# Patient Record
Sex: Male | Born: 2000 | Race: Black or African American | Hispanic: No | Marital: Single | Smoking: Never smoker
Health system: Southern US, Community
[De-identification: ages and names within clinical notes are randomized; demographics above are authoritative.]

## PROBLEM LIST (undated history)

## (undated) DIAGNOSIS — M24411 Recurrent dislocation, right shoulder: Secondary | ICD-10-CM

## (undated) DIAGNOSIS — J302 Other seasonal allergic rhinitis: Secondary | ICD-10-CM

## (undated) DIAGNOSIS — F909 Attention-deficit hyperactivity disorder, unspecified type: Secondary | ICD-10-CM

## (undated) HISTORY — PX: CIRCUMCISION: SUR203

## (undated) HISTORY — PX: HERNIA REPAIR: SHX51

---
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
## 2022-02-07 ENCOUNTER — Other Ambulatory Visit: Payer: Self-pay

## 2022-02-07 ENCOUNTER — Emergency Department (HOSPITAL_BASED_OUTPATIENT_CLINIC_OR_DEPARTMENT_OTHER)
Admission: EM | Admit: 2022-02-07 | Discharge: 2022-02-07 | Disposition: A | Discharge status: Home / Self Care | Payer: Medicaid Other | Attending: Emergency Medicine | Admitting: Emergency Medicine

## 2022-02-07 ENCOUNTER — Encounter (HOSPITAL_BASED_OUTPATIENT_CLINIC_OR_DEPARTMENT_OTHER): Payer: Self-pay | Admitting: Emergency Medicine

## 2022-02-07 DIAGNOSIS — J029 Acute pharyngitis, unspecified: Secondary | ICD-10-CM | POA: Diagnosis present

## 2022-02-07 DIAGNOSIS — J069 Acute upper respiratory infection, unspecified: Secondary | ICD-10-CM | POA: Diagnosis not present

## 2022-02-07 MED ORDER — FLUTICASONE PROPIONATE 50 MCG/ACT NA SUSP: 2.0000 | Freq: Every day | NASAL | 0 refills | Status: AC

## 2022-02-07 MED ORDER — IBUPROFEN 600 MG PO TABS: 600.0000 mg | ORAL_TABLET | Freq: Four times a day (QID) | ORAL | 0 refills | Status: AC | PRN

## 2022-02-07 MED ORDER — IBUPROFEN 400 MG PO TABS
600.0000 mg | ORAL_TABLET | Freq: Once | ORAL | Status: AC
  Administered 2022-02-07: 600 mg via ORAL
  Filled 2022-02-07: qty 1

## 2022-02-07 MED ORDER — DEXAMETHASONE 4 MG PO TABS
10.0000 mg | ORAL_TABLET | Freq: Once | ORAL | Status: AC
  Administered 2022-02-07: 10 mg via ORAL
  Filled 2022-02-07: qty 3

## 2022-02-07 NOTE — ED Triage Notes (Addendum)
Pt c/o left ear pain, left sided sore throat, and left sided jaw pain x 2 days. Pt had negative strep and covid tests at [facility] yesterday.

## 2022-02-07 NOTE — ED Provider Notes (Signed)
[facility] EMERGENCY DEPARTMENT Provider Note  CSN: [id number] Arrival date & time: 02/07/22 4709  Chief Complaint(s) Ear Pain  HPI Shane Mejia is a 21 y.o. male presenting to the emergency department sore throat.  Patient reports sore throat for 2 to 3 days.  He also reports runny nose, no congestion.  He reports that yesterday developed left ear pain.  No fevers or chills.  No nausea or vomiting.  No cough.  No abdominal or chest pain.  Symptoms mild.  Went to UC, told he had allergies, taking allergy medicine without improvement.   Past Medical History Past Medical History:  Diagnosis Date   ADHD (attention deficit hyperactivity disorder)    Seasonal allergies    Shoulder dislocation, recurrent, right    There are no problems to display for this patient.  Home Medication(s) Prior to Admission medications   Medication Sig Start Date End Date Taking? Authorizing Provider  fluticasone (FLONASE) 50 MCG/ACT nasal spray Place 2 sprays into both nostrils daily. 02/07/22 03/09/22 Yes Lonell Grandchild, MD  ibuprofen (ADVIL) 600 MG tablet Take 1 tablet (600 mg total) by mouth every 6 (six) hours as needed. 02/07/22  Yes Lonell Grandchild, MD  cetirizine (ZYRTEC) 10 MG tablet Take by mouth. 09/14/17   [provider]  diazepam (VALIUM) 5 MG tablet Take 5mg  approximately 30 minutes before procedure and repeat if needed 09/11/17   [provider]  diphenhydrAMINE (BENADRYL) 25 MG tablet 1 to 2 tablets every 6 hours as needed for rash and itching. 01/11/20   01/13/20, MD  Lisdexamfetamine Dimesylate (VYVANSE PO) Take by mouth.    [provider]  predniSONE (DELTASONE) 20 MG tablet 2 tabs po daily x 3 days 01/11/20   01/13/20, MD                                                                                                                                    Past Surgical History Past Surgical History:  Procedure Laterality Date    CIRCUMCISION     HERNIA REPAIR     Family History History reviewed. No pertinent family history.  Social History Social History   Tobacco Use   Smoking status: Never   Smokeless tobacco: Never  Substance Use Topics   Alcohol use: No    Comment: child   Allergies Gabapentin  Review of Systems Review of Systems  All other systems reviewed and are negative.   Physical Exam Vital Signs  I have reviewed the triage vital signs BP 139/81   Pulse 67   Temp 98.5 F (36.9 C)   Resp 18   Ht 6' (1.829 m)   Wt 65.8 kg   SpO2 100%   BMI 19.67 kg/m  Physical Exam Vitals and nursing note reviewed.  Constitutional:      General: He is not in acute distress.    Appearance: Normal appearance.  HENT:  Right Ear: Tympanic membrane normal.     Left Ear: Tympanic membrane normal.     Mouth/Throat:     Mouth: Mucous membranes are moist.     Comments: Posterior pharynx erythematous, no tonsillar enlargement or tonsillar exudate.  No facial tenderness Eyes:     Conjunctiva/sclera: Conjunctivae normal.  Cardiovascular:     Rate and Rhythm: Normal rate and regular rhythm.  Pulmonary:     Effort: Pulmonary effort is normal. No respiratory distress.     Breath sounds: Normal breath sounds.  Abdominal:     General: Abdomen is flat.     Palpations: Abdomen is soft.     Tenderness: There is no abdominal tenderness.  Musculoskeletal:     Right lower leg: No edema.     Left lower leg: No edema.  Skin:    General: Skin is warm and dry.     Capillary Refill: Capillary refill takes less than 2 seconds.  Neurological:     Mental Status: He is alert and oriented to person, place, and time. Mental status is at baseline.  Psychiatric:        Mood and Affect: Mood normal.        Behavior: Behavior normal.     ED Results and Treatments Labs (all labs ordered are listed, but only abnormal results are displayed) Labs Reviewed - No data to display                                                                                                                         Radiology No results found.  Pertinent labs & imaging results that were available during my care of the patient were reviewed by me and considered in my medical decision making (see MDM for details).  Medications Ordered in ED Medications  dexamethasone (DECADRON) tablet 10 mg (has no administration in time range)  ibuprofen (ADVIL) tablet 600 mg (has no administration in time range)                                                                                                                                     Procedures Procedures  (including critical care time)  Medical Decision Making / ED Course   MDM:  21 year old male presenting to the emergency department with sore throat and ear pain.  Suspect viral URI.  TM exam normal bilaterally without evidence of otitis media.  No  sign of otitis externa.  No mastoid tenderness to suggest mastoiditis.  He was tested for strep apparently yesterday which was negative, low concern for strep infection currently without tonsillar enlargement or exudates.  No signs of peritonsillar abscess, Ludwig's angina or serious deep space neck infection. Suspect some component of postnasal drip given runny nose, will give Flonase.  Advised taking Tylenol Motrin for his pain. Will discharge patient to home. All questions answered. Patient comfortable with plan of discharge. Return precautions discussed with patient and specified on the after visit summary.       Medicines ordered and prescription drug management: Meds ordered this encounter  Medications   dexamethasone (DECADRON) tablet 10 mg   ibuprofen (ADVIL) tablet 600 mg   fluticasone (FLONASE) 50 MCG/ACT nasal spray    Sig: Place 2 sprays into both nostrils daily.    Dispense:  11.1 mL    Refill:  0   ibuprofen (ADVIL) 600 MG tablet    Sig: Take 1 tablet (600 mg total) by mouth every 6 (six) hours as needed.     Dispense:  30 tablet    Refill:  0    -I have reviewed the patients home medicines and have made adjustments as needed   Co morbidities that complicate the patient evaluation  Past Medical History:  Diagnosis Date   ADHD (attention deficit hyperactivity disorder)    Seasonal allergies    Shoulder dislocation, recurrent, right       Dispostion: Disposition decision including need for hospitalization was considered, and patient discharged from emergency department.    Final Clinical Impression(s) / ED Diagnoses Final diagnoses:  Viral upper respiratory tract infection     This chart was dictated using voice recognition software.  Despite best efforts to proofread,  errors can occur which can change the documentation meaning.    Cristie Hem, MD 02/07/22 [phone]

## 2022-08-24 ENCOUNTER — Other Ambulatory Visit: Payer: Self-pay

## 2022-08-24 ENCOUNTER — Emergency Department (HOSPITAL_BASED_OUTPATIENT_CLINIC_OR_DEPARTMENT_OTHER)
Admission: EM | Admit: 2022-08-24 | Discharge: 2022-08-24 | Disposition: A | Discharge status: Home / Self Care | Payer: Medicaid Other | Attending: Emergency Medicine | Admitting: Emergency Medicine

## 2022-08-24 ENCOUNTER — Encounter (HOSPITAL_BASED_OUTPATIENT_CLINIC_OR_DEPARTMENT_OTHER): Payer: Self-pay | Admitting: Emergency Medicine

## 2022-08-24 ENCOUNTER — Emergency Department (HOSPITAL_BASED_OUTPATIENT_CLINIC_OR_DEPARTMENT_OTHER): Payer: Medicaid Other

## 2022-08-24 DIAGNOSIS — S4991XA Unspecified injury of right shoulder and upper arm, initial encounter: Secondary | ICD-10-CM | POA: Diagnosis present

## 2022-08-24 DIAGNOSIS — Y9339 Activity, other involving climbing, rappelling and jumping off: Secondary | ICD-10-CM | POA: Diagnosis not present

## 2022-08-24 DIAGNOSIS — X509XXA Other and unspecified overexertion or strenuous movements or postures, initial encounter: Secondary | ICD-10-CM | POA: Diagnosis not present

## 2022-08-24 DIAGNOSIS — S43004A Unspecified dislocation of right shoulder joint, initial encounter: Secondary | ICD-10-CM | POA: Insufficient documentation

## 2022-08-24 MED ORDER — PROPOFOL 10 MG/ML IV BOLUS
1.0000 mg/kg | Freq: Once | INTRAVENOUS | Status: AC
  Administered 2022-08-24: 72.6 mg via INTRAVENOUS
  Filled 2022-08-24: qty 20

## 2022-08-24 MED ORDER — PROPOFOL 10 MG/ML IV BOLUS
INTRAVENOUS | Status: AC | PRN
  Administered 2022-08-24: 40 mg via INTRAVENOUS
  Administered 2022-08-24: 72.6 mg via INTRAVENOUS

## 2022-08-24 MED ORDER — FENTANYL CITRATE PF 50 MCG/ML IJ SOSY
25.0000 ug | PREFILLED_SYRINGE | Freq: Once | INTRAMUSCULAR | Status: AC
  Administered 2022-08-24: 25 ug via INTRAVENOUS
  Filled 2022-08-24: qty 1

## 2022-08-24 NOTE — ED Notes (Signed)
Pt able to ambulate and drink water  

## 2022-08-24 NOTE — ED Triage Notes (Signed)
Pt reports he was running track and was trying to jump a fence, feel and hit his rt shoulder, obvious deformity and displacement, reports having hx of prev dislocations and had sx in 2021 for the same

## 2022-08-24 NOTE — ED Provider Notes (Signed)
[place] EMERGENCY DEPARTMENT AT [facility] Provider Note   CSN: [id number] Arrival date & time: 08/24/22  2013     History  Chief Complaint  Patient presents with   Shoulder Injury    Shane Mejia is a 22 y.o. male presented to ED with concern for right shoulder dislocation and pain.  Patient reports that he was climbing over a fence for a field event and felt the shoulder pop out.  This occurred immediately prior to coming to the hospital.  He reports prior history of shoulder dislocations and his mother bedside reports he had "surgery on his shoulder in the past".  He is right-handed.  He has not had anything to eat or drink since 4:00 when he had a few chips.  No full meal  HPI     Home Medications Prior to Admission medications   Medication Sig Start Date End Date Taking? Authorizing Provider  cetirizine (ZYRTEC) 10 MG tablet Take by mouth. 09/14/17   [provider]  diazepam (VALIUM) 5 MG tablet Take 5mg  approximately 30 minutes before procedure and repeat if needed 09/11/17   [provider]  diphenhydrAMINE (BENADRYL) 25 MG tablet 1 to 2 tablets every 6 hours as needed for rash and itching. 01/11/20   Arby Barrette, MD  fluticasone (FLONASE) 50 MCG/ACT nasal spray Place 2 sprays into both nostrils daily. 02/07/22 03/09/22  Lonell Grandchild, MD  ibuprofen (ADVIL) 600 MG tablet Take 1 tablet (600 mg total) by mouth every 6 (six) hours as needed. 02/07/22   Lonell Grandchild, MD  Lisdexamfetamine Dimesylate (VYVANSE PO) Take by mouth.    [provider]  predniSONE (DELTASONE) 20 MG tablet 2 tabs po daily x 3 days 01/11/20   Arby Barrette, MD      Allergies    Gabapentin    Review of Systems   Review of Systems  Physical Exam Updated Vital Signs BP 115/65   Pulse (!) 55   Temp 98.4 F (36.9 C) (Oral)   Resp (!) 28   Ht 6\' 1"  (1.854 m)   Wt 72.6 kg   SpO2 100%   BMI 21.11 kg/m  Physical Exam Constitutional:       General: He is not in acute distress. HENT:     Head: Normocephalic and atraumatic.  Eyes:     Conjunctiva/sclera: Conjunctivae normal.     Pupils: Pupils are equal, round, and reactive to light.  Cardiovascular:     Rate and Rhythm: Normal rate and regular rhythm.     Pulses: Normal pulses.  Pulmonary:     Effort: Pulmonary effort is normal. No respiratory distress.  Musculoskeletal:     Comments: Squaring and deformity of the right shoulder  Skin:    General: Skin is warm and dry.  Neurological:     General: No focal deficit present.     Mental Status: He is alert and oriented to person, place, and time. Mental status is at baseline.     Sensory: No sensory deficit.     Motor: No weakness.  Psychiatric:        Mood and Affect: Mood normal.        Behavior: Behavior normal.     ED Results / Procedures / Treatments   Labs (all labs ordered are listed, but only abnormal results are displayed) Labs Reviewed - No data to display  EKG None  Radiology DG Shoulder Right Portable  Result Date: 08/24/2022 CLINICAL DATA:  Interval reduction EXAM:  RIGHT SHOULDER - 1 VIEW COMPARISON:  Right shoulder radiograph August 24, 2022 FINDINGS: Limited assessment with single AP view. Interval reduction of the right glenohumeral joint. No acute fracture. The visualized right lung is clear. IMPRESSION: Interval reduction of the right glenohumeral joint within limitations of single AP view. Electronically Signed   By: Jacob Moores M.D.   On: 08/24/2022 21:57   DG Shoulder Right  Result Date: 08/24/2022 CLINICAL DATA:  Right shoulder pain EXAM: RIGHT SHOULDER - 3 VIEW COMPARISON:  Right shoulder radiographs July 31, 2019 and August 01, 2019 FINDINGS: Right anterior glenohumeral dislocation. No acute fracture. Mild overlying soft tissue swelling. The visualized right lung is clear. IMPRESSION: Right anterior glenohumeral dislocation. Electronically Signed   By: Jacob Moores M.D.   On: 08/24/2022  20:44    Procedures .Ortho Injury Treatment  Date/Time: 08/24/2022 10:46 PM  Performed by: Terald Sleeper, MD Authorized by: Terald Sleeper, MD   Consent:    Consent obtained:  Verbal   Consent given by:  Parent and patient   Risks discussed:  Recurrent dislocationInjury location: shoulder Location details: right shoulder Injury type: dislocation Dislocation type: anterior Hill-Sachs deformity: no Chronicity: recurrent Pre-procedure neurovascular assessment: neurovascularly intact Pre-procedure distal perfusion: normal Pre-procedure neurological function: normal Pre-procedure range of motion: reduced  Anesthesia: Local anesthesia used: no  Patient sedated: Yes. Refer to sedation procedure documentation for details of sedation. Manipulation performed: yes Reduction method: traction and counter traction Reduction successful: yes X-ray confirmed reduction: yes Immobilization: sling Post-procedure neurovascular assessment: post-procedure neurovascularly intact Post-procedure distal perfusion: normal Post-procedure neurological function: normal Post-procedure range of motion: improved   .Sedation  Date/Time: 08/24/2022 10:47 PM  Performed by: Terald Sleeper, MD Authorized by: Terald Sleeper, MD   Consent:    Consent obtained:  Verbal   Consent given by:  Patient and parent   Risks discussed:  Allergic reaction, dysrhythmia, inadequate sedation, nausea, vomiting, respiratory compromise necessitating ventilatory assistance and intubation, prolonged hypoxia resulting in organ damage and prolonged sedation necessitating reversal   Alternatives discussed:  Regional anesthesia, analgesia without sedation and anxiolysis Universal protocol:    Procedure explained and questions answered to patient or proxy's satisfaction: yes     Relevant documents present and verified: yes     Test results available: yes     Imaging studies available: yes     Required blood products,  implants, devices, and special equipment available: yes     Site/side marked: yes     Immediately prior to procedure, a time out was called: yes     Patient identity confirmed:  Arm band and verbally with patient Indications:    Procedure performed:  Dislocation reduction   Procedure necessitating sedation performed by:  Physician performing sedation Pre-sedation assessment:    Time since last food or drink:  6 hour   ASA classification: class 1 - normal, healthy patient     Mallampati score:  I - soft palate, uvula, fauces, pillars visible   Neck mobility: normal     Pre-sedation assessments completed and reviewed: airway patency, cardiovascular function, hydration status, mental status, nausea/vomiting, pain level, respiratory function and temperature   Immediate pre-procedure details:    Reassessment: Patient reassessed immediately prior to procedure     Reviewed: vital signs, relevant labs/tests and NPO status     Verified: bag valve mask available, emergency equipment available, intubation equipment available, IV patency confirmed and reversal medications available   Procedure details (see MAR for exact dosages):  Preoxygenation:  Nasal cannula   Sedation:  Propofol   Intended level of sedation: deep   Analgesia:  Fentanyl   Intra-procedure monitoring:  Blood pressure monitoring, cardiac monitor, continuous capnometry, continuous pulse oximetry, frequent LOC assessments and frequent vital sign checks   Intra-procedure events: none     Total Provider sedation time (minutes):  30 Post-procedure details:    Post-sedation assessment completed:  08/24/2022 10:48 PM   Attendance: Constant attendance by certified staff until patient recovered     Recovery: Patient returned to pre-procedure baseline     Post-sedation assessments completed and reviewed: airway patency, cardiovascular function, hydration status, mental status, nausea/vomiting, pain level, respiratory function and temperature      Patient is stable for discharge or admission: yes     Procedure completion:  Tolerated well, no immediate complications     Medications Ordered in ED Medications  fentaNYL (SUBLIMAZE) injection 25 mcg (25 mcg Intravenous Given 08/24/22 2106)  propofol (DIPRIVAN) 10 mg/mL bolus/IV push 72.6 mg (72.6 mg Intravenous Given 08/24/22 2133)  propofol (DIPRIVAN) 10 mg/mL bolus/IV push (40 mg Intravenous Given 08/24/22 2137)    ED Course/ Medical Decision Making/ A&P Clinical Course as of 08/24/22 2248  Wed Aug 24, 2022  2151 Successful reduction of the shoulder.  Patient is awake from anesthesia.  His mother is present at the bedside and reviewed discharge instructions.  He already sees an orthopedic specialist and they will follow-up with him in the clinic. [MT]    Clinical Course User Index [MT] Kendell Sagraves, Kermit Balo, MD                             Medical Decision Making Amount and/or Complexity of Data Reviewed Radiology: ordered.  Risk Prescription drug management.   Patient is presented to the ED with concern for right shoulder dislocation.  He is neurovascularly intact.  Appears to have an anterior shoulder dislocation on x-ray imaging.  The patient was consented for propofol sedation.  We discussed alternatives including a regional block as well as IV pain medications, but both the patient and his mother at bedside refuses alternatives.  They understand the risks of sedation and include hypotension, allergic reaction, respiratory depression, respiratory failure, organ damage and death.  Patient shoulder successfully reduced after small dose of fentanyl as well as propofol for sedation.  No significant events noted during reduction.  Repeat x-rays were reviewed showing successful reduction.  Patient was placed in an arm sling and will be discharged with his mother home.  He was noted to be fully awake from sedation and ambulating steadily at the time of discharge.  He was advised not to  drive and his mother will be driving.        Final Clinical Impression(s) / ED Diagnoses Final diagnoses:  Dislocation of right shoulder joint, initial encounter    Rx / DC Orders ED Discharge Orders     None         Zoii Florer, Kermit Balo, MD 08/24/22 2248

## 2024-02-01 ENCOUNTER — Emergency Department (HOSPITAL_BASED_OUTPATIENT_CLINIC_OR_DEPARTMENT_OTHER)
Admission: EM | Admit: 2024-02-01 | Discharge: 2024-02-01 | Disposition: A | Discharge status: Home / Self Care | Payer: Self-pay | Attending: Emergency Medicine | Admitting: Emergency Medicine

## 2024-02-01 ENCOUNTER — Other Ambulatory Visit: Payer: Self-pay

## 2024-02-01 ENCOUNTER — Emergency Department (HOSPITAL_BASED_OUTPATIENT_CLINIC_OR_DEPARTMENT_OTHER): Payer: Self-pay

## 2024-02-01 ENCOUNTER — Encounter (HOSPITAL_BASED_OUTPATIENT_CLINIC_OR_DEPARTMENT_OTHER): Payer: Self-pay

## 2024-02-01 DIAGNOSIS — X501XXA Overexertion from prolonged static or awkward postures, initial encounter: Secondary | ICD-10-CM | POA: Insufficient documentation

## 2024-02-01 DIAGNOSIS — S43004A Unspecified dislocation of right shoulder joint, initial encounter: Secondary | ICD-10-CM | POA: Insufficient documentation

## 2024-02-01 MED ORDER — PROPOFOL 10 MG/ML IV BOLUS
1.0000 mg/kg | Freq: Once | INTRAVENOUS | Status: AC
  Administered 2024-02-01: 50 mg via INTRAVENOUS
  Filled 2024-02-01: qty 20

## 2024-02-01 MED ORDER — CYCLOBENZAPRINE HCL 10 MG PO TABS: 10.0000 mg | ORAL_TABLET | Freq: Two times a day (BID) | ORAL | 0 refills | Status: AC | PRN

## 2024-02-01 MED ORDER — PROPOFOL 10 MG/ML IV BOLUS
INTRAVENOUS | Status: AC | PRN
  Administered 2024-02-01: 20 mg via INTRAVENOUS
  Administered 2024-02-01: 30 mg via INTRAVENOUS
  Administered 2024-02-01: 50 mg via INTRAVENOUS

## 2024-02-01 MED ORDER — HYDROMORPHONE HCL 1 MG/ML IJ SOLN
1.0000 mg | Freq: Once | INTRAMUSCULAR | Status: AC
  Administered 2024-02-01: 1 mg via INTRAVENOUS
  Filled 2024-02-01: qty 1

## 2024-02-01 NOTE — ED Notes (Signed)
 Pt received a total of 150 mg of propofol  for his moderate sedation

## 2024-02-01 NOTE — ED Notes (Signed)
 Respiratory Therapist at [facility] in room number 4 during procedure.  Suction with [place] at [facility] set up and ready to use. Ambu bag at [facility] and ready to use.  Patient placed on ETCO2 Nasal Cannula at 2 LPM.    Vitals at conclusion of procedure:  ETCO2 35 mmHg HR 78 RR 19 SPO2 100%  Patient awake able alert now. RT to monitor as needed

## 2024-02-01 NOTE — ED Provider Notes (Addendum)
 [place] [facility] Provider Note   CSN: [id number] Arrival date & time: 02/01/24  1344     Patient presents with: Shoulder Pain   Shane Mejia is a 23 y.o. male.   HPI    23yo male with history of ADHD, recurrent shoulder dislocation who presents with concern for shoulder dislocation.  Stretching and then shoulder came out. Feels like prior dislocations. Severe pain, worse with movement.  No numbness or weakness. No falls.     Has had this happen 3 times since surgery when a teenager.  Does not want reduction to be attempted without sedation.   Past Medical History:  Diagnosis Date   ADHD (attention deficit hyperactivity disorder)    Seasonal allergies    Shoulder dislocation, recurrent, right     Prior to Admission medications  Medication Sig Start Date End Date Taking? Authorizing Provider  cetirizine (ZYRTEC) 10 MG tablet Take by mouth. 09/14/17   [provider]  diazepam (VALIUM) 5 MG tablet Take 5mg  approximately 30 minutes before procedure and repeat if needed 09/11/17   [provider]  diphenhydrAMINE  (BENADRYL ) 25 MG tablet 1 to 2 tablets every 6 hours as needed for rash and itching. 01/11/20   Armenta Canning, MD  fluticasone  (FLONASE ) 50 MCG/ACT nasal spray Place 2 sprays into both nostrils daily. 02/07/22 03/09/22  Francesca Elsie CROME, MD  ibuprofen  (ADVIL ) 600 MG tablet Take 1 tablet (600 mg total) by mouth every 6 (six) hours as needed. 02/07/22   Francesca Elsie CROME, MD  Lisdexamfetamine Dimesylate (VYVANSE PO) Take by mouth.    [provider]  predniSONE  (DELTASONE ) 20 MG tablet 2 tabs po daily x 3 days 01/11/20   Armenta Canning, MD    Allergies: Gabapentin    Review of Systems  Updated Vital Signs BP 136/78 (BP Location: Left Arm)   Pulse 77   Temp 98.6 F (37 C) (Oral)   Resp (!) 30   Ht 6' 1 (1.854 m)   Wt 68 kg   SpO2 100%   BMI 19.79 kg/m   Physical Exam Vitals and nursing  note reviewed.  Constitutional:      General: He is not in acute distress.    Appearance: He is well-developed. He is not diaphoretic.  HENT:     Head: Normocephalic and atraumatic.  Eyes:     Conjunctiva/sclera: Conjunctivae normal.  Cardiovascular:     Rate and Rhythm: Normal rate and regular rhythm.     Pulses: Normal pulses.     Heart sounds: Normal heart sounds. No murmur heard.    No friction rub. No gallop.  Pulmonary:     Effort: Pulmonary effort is normal. No respiratory distress.     Breath sounds: Normal breath sounds. No wheezing or rales.  Abdominal:     General: There is no distension.     Palpations: Abdomen is soft.     Tenderness: There is no abdominal tenderness. There is no guarding.  Musculoskeletal:        General: Tenderness (right shoulder) and deformity (right shoulder) present.     Cervical back: Normal range of motion.  Skin:    General: Skin is warm and dry.  Neurological:     Mental Status: He is alert and oriented to person, place, and time.     (all labs ordered are listed, but only abnormal results are displayed) Labs Reviewed - No data to display  EKG: None  Radiology: [facility] Shoulder Right Port  Result Date: 02/01/2024 CLINICAL DATA:  Shoulder dislocation. EXAM: RIGHT SHOULDER - 1 VIEW COMPARISON:  08/24/2022. FINDINGS: Anterior inferior dislocation of the humeral head relative to the glenoid. No discrete fracture identified. IMPRESSION: Right anterior glenohumeral dislocation. Electronically Signed   By: Harrietta Sherry M.D.   On: 02/01/2024 15:20     .Sedation  Date/Time: 02/01/2024 10:27 PM  Performed by: Dreama Longs, MD Authorized by: Dreama Longs, MD   Consent:    Consent obtained:  Verbal   Consent given by:  Patient   Risks discussed:  Allergic reaction, dysrhythmia, inadequate sedation, nausea, prolonged hypoxia resulting in organ damage, prolonged sedation necessitating reversal, respiratory compromise necessitating  ventilatory assistance and intubation and vomiting   Alternatives discussed:  Analgesia without sedation, anxiolysis and regional anesthesia Universal protocol:    Procedure explained and questions answered to patient or proxy's satisfaction: yes     Relevant documents present and verified: yes     Test results available: yes     Imaging studies available: yes     Required blood products, implants, devices, and special equipment available: yes     Site/side marked: yes     Immediately prior to procedure, a time out was called: yes     Patient identity confirmed:  Verbally with patient Indications:    Procedure necessitating sedation performed by:  Physician performing sedation Pre-sedation assessment:    Time since last food or drink:  4   ASA classification: class 1 - normal, healthy patient     Mouth opening:  3 or more finger widths   Thyromental distance:  4 finger widths   Mallampati score:  I - soft palate, uvula, fauces, pillars visible   Neck mobility: normal     Pre-sedation assessments completed and reviewed: airway patency, cardiovascular function, hydration status, mental status, nausea/vomiting, pain level, respiratory function and temperature   A pre-sedation assessment was completed prior to the start of the procedure Immediate pre-procedure details:    Reassessment: Patient reassessed immediately prior to procedure     Reviewed: vital signs, relevant labs/tests and NPO status     Verified: bag valve mask available, emergency equipment available, intubation equipment available, IV patency confirmed, oxygen available and suction available   Procedure details (see MAR for exact dosages):    Preoxygenation:  Nasal cannula   Sedation:  Propofol    Intended level of sedation: deep   Intra-procedure monitoring:  Blood pressure monitoring, cardiac monitor, continuous pulse oximetry, frequent LOC assessments, frequent vital sign checks and continuous capnometry   Intra-procedure  events: none     Total Provider sedation time (minutes):  15 Post-procedure details:   A post-sedation assessment was completed following the completion of the procedure.   Attendance: Constant attendance by certified staff until patient recovered     Recovery: Patient returned to pre-procedure baseline     Post-sedation assessments completed and reviewed: airway patency, cardiovascular function, hydration status, mental status, nausea/vomiting, pain level, respiratory function and temperature     Patient is stable for discharge or admission: yes     Procedure completion:  Tolerated well, no immediate complications .Reduction of dislocation  Date/Time: 02/01/2024 10:28 PM  Performed by: Dreama Longs, MD Authorized by: Dreama Longs, MD  Consent: Verbal consent obtained. Written consent obtained Risks and benefits: risks, benefits and alternatives were discussed Consent given by: patient Patient understanding: patient states understanding of the procedure being performed Patient consent: the patient's understanding of the procedure matches consent given Procedure consent: procedure consent matches procedure  scheduled Relevant documents: relevant documents present and verified Test results: test results available and properly labeled Site marked: the operative site was marked Imaging studies: imaging studies available Required items: required blood products, implants, devices, and special equipment available Time out: Immediately prior to procedure a time out was called to verify the correct patient, procedure, equipment, support staff and site/side marked as required. Local anesthesia used: no  Anesthesia: Local anesthesia used: no  Sedation: Patient sedated: yes Sedatives: propofol  Analgesia: hydromorphone Sedation start date/time: 02/01/2024 3:02 PM Sedation end date/time: 02/01/2024 3:29 PM Vitals: Vital signs were monitored during sedation.  Patient tolerance:  patient tolerated the procedure well with no immediate complications      Medications Ordered in the ED  propofol  (DIPRIVAN ) 10 mg/mL bolus/IV push (30 mg Intravenous Given 02/01/24 1512)  HYDROmorphone (DILAUDID) injection 1 mg (1 mg Intravenous Given 02/01/24 1435)  propofol  (DIPRIVAN ) 10 mg/mL bolus/IV push 68 mg (50 mg Intravenous Given 02/01/24 1508)   150mg  propofol                                   23yo male with history of ADHD, recurrent shoulder dislocation who presents with concern for shoulder dislocation.  NV intact.  XR evaluated by me personally shows anterior dislocation of shoulder.   Given pain medications. Discussed risks and benefits of sedation versus other methods for reduction and he consents to sedation.  Given propofol  and shoulder reduced. Placed in shoulder immobilizer.  Has follow up tomorrow with his Orthopedic dr. Given rx for flexeril. Plan on dc after observation following sedation for one hour.      Final diagnoses:  Dislocation of right shoulder joint, initial encounter    ED Discharge Orders     None          Dreama Longs, MD 02/01/24 2230    Dreama Longs, MD 02/01/24 2230

## 2024-02-01 NOTE — Procedures (Deleted)
 Respiratory Therapist at [facility] in room number 4 during procedure.  Suction with [place] at [facility] set up and ready to use. Ambu bag at [facility] and ready to use.  Patient placed on ETCO2 Nasal Cannula at 2 LPM.    Vitals at conclusion of procedure:  ETCO2 35 mmHg HR 78 RR 19 SPO2 100%  Patient awake able alert now. RT to monitor as needed

## 2024-02-01 NOTE — ED Triage Notes (Signed)
 Pt states that he was lying in bed and his right shoulder popped out of the socket. States that this has happened before.

## 2024-02-01 NOTE — Sedation Documentation (Signed)
 Pt is responsive to pain. Verbally grimacing while trying to get right shoulder back in socket. EDP at bedside for sedation.
